# Patient Record
Sex: Male | Born: 1985 | Race: Black or African American | Hispanic: No | Marital: Married
Health system: Southern US, Community
[De-identification: ages and names within clinical notes are randomized; demographics above are authoritative.]

## PROBLEM LIST (undated history)

## (undated) DIAGNOSIS — G8929 Other chronic pain: Secondary | ICD-10-CM

---

## 2018-01-15 ENCOUNTER — Encounter (HOSPITAL_COMMUNITY): Payer: Self-pay

## 2018-01-15 ENCOUNTER — Other Ambulatory Visit: Payer: Self-pay

## 2018-01-15 DIAGNOSIS — J069 Acute upper respiratory infection, unspecified: Secondary | ICD-10-CM | POA: Insufficient documentation

## 2018-01-16 ENCOUNTER — Emergency Department (HOSPITAL_COMMUNITY)
Admission: EM | Admit: 2018-01-16 | Discharge: 2018-01-16 | Disposition: A | Discharge status: Home / Self Care | Payer: Self-pay | Attending: Emergency Medicine | Admitting: Emergency Medicine

## 2018-01-16 DIAGNOSIS — J069 Acute upper respiratory infection, unspecified: Secondary | ICD-10-CM

## 2018-01-16 LAB — INFLUENZA PANEL BY PCR (TYPE A & B)
INFLBPCR: NEGATIVE
Influenza A By PCR: NEGATIVE

## 2018-01-16 NOTE — ED Provider Notes (Signed)
[facility] EMERGENCY DEPARTMENT Provider Note   CSN: [id number] Arrival date & time: 01/15/18  2155     History   Chief Complaint Chief Complaint  Patient presents with  . Fatigue  . Cough    HPI Larry Kaiser is a 32 y.o. male.  Patient without significant medical history presents with complaint of cough, congestion, minor sore throat, cough and body aches. Symptoms started 2 days ago. No nausea or vomiting. He reports infrequent loose stools. He denies fever but reports minor cold chills. No known sick contacts. He is a smoker.    The history is provided by the patient. No language interpreter was used.  Cough  Associated symptoms include chills, rhinorrhea, sore throat and myalgias. Pertinent negatives include no chest pain and no shortness of breath.    No past medical history on file.  There are no active problems to display for this patient.     Home Medications    Prior to Admission medications   Not on File    Family History No family history on file.  Social History Social History   Tobacco Use  . Smoking status: Never Smoker  . Smokeless tobacco: Never Used  Substance Use Topics  . Alcohol use: Yes    Frequency: Never  . Drug use: No     Allergies   Patient has no known allergies.   Review of Systems Review of Systems  Constitutional: Positive for chills. Negative for activity change, appetite change and fever.  HENT: Positive for congestion, rhinorrhea and sore throat.   Respiratory: Positive for cough and chest tightness. Negative for shortness of breath.   Cardiovascular: Negative for chest pain.       Chest pain with cough.  Gastrointestinal: Negative.  Negative for abdominal pain, nausea and vomiting.  Musculoskeletal: Positive for myalgias.  Skin: Negative.  Negative for rash.  Neurological: Negative.      Physical Exam Updated Vital Signs BP 125/81 (BP Location: Right Arm)   Pulse 75   Temp 98.4  F (36.9 C) (Oral)   Resp 17   Ht 6' (1.829 m)   Wt 129.3 kg (285 lb)   SpO2 98%   BMI 38.65 kg/m   Physical Exam  Constitutional: He is oriented to person, place, and time. He appears well-developed and well-nourished.  HENT:  Head: Normocephalic.  Nose: No mucosal edema.  Mouth/Throat: Uvula is midline, oropharynx is clear and moist and mucous membranes are normal. Mucous membranes are not dry.  Neck: Normal range of motion. Neck supple.  Cardiovascular: Normal rate and regular rhythm.  Pulmonary/Chest: Effort normal and breath sounds normal. He has no wheezes. He has no rales. He exhibits no tenderness.  Abdominal: Soft. Bowel sounds are normal. There is no tenderness. There is no rebound and no guarding.  Musculoskeletal: Normal range of motion.  Neurological: He is alert and oriented to person, place, and time.  Skin: Skin is warm and dry. No rash noted.  Psychiatric: He has a normal mood and affect.     ED Treatments / Results  Labs (all labs ordered are listed, but only abnormal results are displayed) Labs Reviewed  INFLUENZA PANEL BY PCR (TYPE A & B)    EKG  EKG Interpretation None       Radiology No results found.  Procedures Procedures (including critical care time)  Medications Ordered in ED Medications - No data to display   Initial Impression / Assessment and Plan / ED Course  I have reviewed the triage vital signs and the nursing notes.  Pertinent labs & imaging results that were available during my care of the patient were reviewed by me and considered in my medical decision making (see chart for details).     Patient here with URI symptoms, afebrile with no history fever. Influenza negative. He is otherwise healthy. VSS. Recommend supportive care for viral URI.  Final Clinical Impressions(s) / ED Diagnoses   Final diagnoses:  None   1. URI  ED Discharge Orders    None       Elpidio AnisUpstill, Tamiya Colello, PA-C 01/16/18 [id number]    Devoria AlbeKnapp, Iva,  MD 01/16/18 0600

## 2018-01-16 NOTE — ED Notes (Signed)
Patient Alert and oriented X4. Stable and ambulatory. Patient verbalized understanding of the discharge instructions.  Patient belongings were taken by the patient.  

## 2019-09-05 ENCOUNTER — Other Ambulatory Visit: Payer: Self-pay

## 2019-09-05 ENCOUNTER — Emergency Department (HOSPITAL_COMMUNITY): Payer: No Typology Code available for payment source

## 2019-09-05 ENCOUNTER — Encounter (HOSPITAL_COMMUNITY): Payer: Self-pay | Admitting: Emergency Medicine

## 2019-09-05 ENCOUNTER — Emergency Department (HOSPITAL_COMMUNITY)
Admission: EM | Admit: 2019-09-05 | Discharge: 2019-09-05 | Disposition: A | Discharge status: Home / Self Care | Payer: No Typology Code available for payment source | Attending: Emergency Medicine | Admitting: Emergency Medicine

## 2019-09-05 DIAGNOSIS — Y9389 Activity, other specified: Secondary | ICD-10-CM | POA: Insufficient documentation

## 2019-09-05 DIAGNOSIS — Y9289 Other specified places as the place of occurrence of the external cause: Secondary | ICD-10-CM | POA: Diagnosis not present

## 2019-09-05 DIAGNOSIS — S199XXA Unspecified injury of neck, initial encounter: Secondary | ICD-10-CM | POA: Diagnosis not present

## 2019-09-05 DIAGNOSIS — S0990XA Unspecified injury of head, initial encounter: Secondary | ICD-10-CM

## 2019-09-05 DIAGNOSIS — Y990 Civilian activity done for income or pay: Secondary | ICD-10-CM | POA: Insufficient documentation

## 2019-09-05 DIAGNOSIS — R519 Headache, unspecified: Secondary | ICD-10-CM | POA: Insufficient documentation

## 2019-09-05 DIAGNOSIS — W208XXA Other cause of strike by thrown, projected or falling object, initial encounter: Secondary | ICD-10-CM | POA: Diagnosis not present

## 2019-09-05 DIAGNOSIS — M542 Cervicalgia: Secondary | ICD-10-CM | POA: Diagnosis present

## 2019-09-05 MED ORDER — NAPROXEN 500 MG PO TABS: 500.0000 mg | ORAL_TABLET | Freq: Two times a day (BID) | ORAL | 0 refills | Status: AC

## 2019-09-05 MED ORDER — METHOCARBAMOL 500 MG PO TABS: 500.0000 mg | ORAL_TABLET | Freq: Two times a day (BID) | ORAL | 0 refills | Status: AC

## 2019-09-05 NOTE — Discharge Instructions (Signed)
Your CT scans were reassuring, use Naprosyn, and Robaxin for pain relief, can use Tylenol in addition to these as well as over-the-counter products such as Biofreeze gel or lidocaine patches.  If symptoms or not improving follow-up with your PCP.  If you develop numbness or weakness in the arms, pain shooting into the arms or any new or worsening symptoms please return for reevaluation.

## 2019-09-05 NOTE — ED Provider Notes (Signed)
[facility]-EMERGENCY DEPT Provider Note   CSN: [id number] Arrival date & time: 09/05/19  1049     History   Chief Complaint Chief Complaint  Patient presents with   Neck Pain    HPI Larry Kaiser is a 33 y.o. male.     Larry Kaiser is a 33 y.o. male who is otherwise healthy, presents to the ED for evaluation of headache and neck pain after injury at work yesterday.  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  This did not cause his neck to bend at all.  He denies loss of consciousness, and did not fall to the ground.  He reports that he has had a mild headache since then and is started to have some worsening neck pain now radiating into his thoracic spine.  He denies pain shooting into either arm aside from some mild soreness over the right shoulder where he has had a previous shoulder injury.  He has no numbness tingling or weakness in the extremities.  No vision changes, vomiting or dizziness.  He reports pain radiates a little bit into the upper thoracic spine, no low back pain.  No loss of bowel or bladder control or saddle anesthesia.  He took some Aleve this morning with some relief, nothing else for pain.     History reviewed. No pertinent past medical history.  There are no active problems to display for this patient.   History reviewed. No pertinent surgical history.      Home Medications    Prior to Admission medications   Medication Sig Start Date End Date Taking? Authorizing Provider  methocarbamol (ROBAXIN) 500 MG tablet Take 1 tablet (500 mg total) by mouth 2 (two) times daily. 09/05/19   Dartha Lodge, PA-C  naproxen (NAPROSYN) 500 MG tablet Take 1 tablet (500 mg total) by mouth 2 (two) times daily. 09/05/19   Dartha Lodge, PA-C    Family History No  family history on file.  Social History Social History   Tobacco Use   Smoking status: Never Smoker   Smokeless tobacco: Never Used  Substance Use Topics   Alcohol use: Yes    Frequency: Never   Drug use: No     Allergies   Patient has no known allergies.   Review of Systems Review of Systems  Constitutional: Negative for chills and fever.  Eyes: Negative for visual disturbance.  Gastrointestinal: Negative for nausea and vomiting.  Musculoskeletal: Positive for back pain and neck pain. Negative for neck stiffness.  Skin: Negative for color change and wound.  Neurological: Positive for headaches. Negative for dizziness, syncope, facial asymmetry, speech difficulty, weakness, light-headedness and numbness.  All other systems reviewed and are negative.    Physical Exam Updated Vital Signs BP 123/81 (BP Location: Right Arm)    Pulse 79    Temp 98.3 F (36.8 C) (Oral)    Resp 18    SpO2 98%   Physical Exam Vitals signs and nursing note reviewed.  Constitutional:      General: He is not in acute distress.    Appearance: Normal appearance. He is well-developed and normal weight. He is not ill-appearing or diaphoretic.  HENT:     Head: Normocephalic.     Comments: There is some  mild tenderness over the crown of the head, no palpable deformity or hematoma, no step-off, no abrasions or lacerations, negative battle sign, no raccoon eyes.    Nose: Nose normal.     Mouth/Throat:     Mouth: Mucous membranes are moist.     Pharynx: Oropharynx is clear.  Eyes:     General:        Right eye: No discharge.        Left eye: No discharge.     Extraocular Movements: Extraocular movements intact.     Conjunctiva/sclera: Conjunctivae normal.     Pupils: Pupils are equal, round, and reactive to light.  Neck:     Musculoskeletal: Muscular tenderness present.     Comments: There is some midline C-spine tenderness without palpable deformity or crepitus, no overlying skin  changes. Pulmonary:     Effort: Pulmonary effort is normal. No respiratory distress.  Musculoskeletal:     Comments: There is some pain radiating into the upper thoracic spine without palpable deformity or crepitus.  No midline lumbar tenderness.  Skin:    General: Skin is warm and dry.  Neurological:     Mental Status: He is alert and oriented to person, place, and time.     Coordination: Coordination normal.     Comments: Speech is clear, able to follow commands CN III-XII intact Normal strength in upper and lower extremities bilaterally including dorsiflexion and plantar flexion, strong and equal grip strength Sensation normal to light and sharp touch Moves extremities without ataxia, coordination intact Normal finger to nose and rapid alternating movements No pronator drift  Psychiatric:        Mood and Affect: Mood normal.        Behavior: Behavior normal.      ED Treatments / Results  Labs (all labs ordered are listed, but only abnormal results are displayed) Labs Reviewed - No data to display  EKG None  Radiology Ct Head Wo Contrast  Result Date: 09/05/2019 CLINICAL DATA:  Posttraumatic headache and neck pain after injury yesterday. EXAM: CT HEAD WITHOUT CONTRAST CT CERVICAL SPINE WITHOUT CONTRAST TECHNIQUE: Multidetector CT imaging of the head and cervical spine was performed following the standard protocol without intravenous contrast. Multiplanar CT image reconstructions of the cervical spine were also generated. COMPARISON:  None. FINDINGS: CT HEAD FINDINGS Brain: No evidence of acute infarction, hemorrhage, hydrocephalus, extra-axial collection or mass lesion/mass effect. Vascular: No hyperdense vessel or unexpected calcification. Skull: Normal. Negative for fracture or focal lesion. Sinuses/Orbits: No acute finding. Other: None. CT CERVICAL SPINE FINDINGS Alignment: Normal. Skull base and vertebrae: No acute fracture. No primary bone lesion or focal pathologic process.  Soft tissues and spinal canal: No prevertebral fluid or swelling. No visible canal hematoma. Disc levels:  Normal. Upper chest: Negative. Other: None. IMPRESSION: Normal head CT. Normal cervical spine. Electronically Signed   By: Lupita RaiderJames  Green Jr M.D.   On: 09/05/2019 13:08   Ct Cervical Spine Wo Contrast  Result Date: 09/05/2019 CLINICAL DATA:  Posttraumatic headache and neck pain after injury yesterday. EXAM: CT HEAD WITHOUT CONTRAST CT CERVICAL SPINE WITHOUT CONTRAST TECHNIQUE: Multidetector CT imaging of the head and cervical spine was performed following the standard protocol without intravenous contrast. Multiplanar CT image reconstructions of the cervical spine were also generated. COMPARISON:  None. FINDINGS: CT HEAD FINDINGS Brain: No evidence of acute infarction, hemorrhage, hydrocephalus, extra-axial collection or mass lesion/mass effect. Vascular: No hyperdense vessel or unexpected calcification. Skull: Normal. Negative for fracture or focal lesion. Sinuses/Orbits:  No acute finding. Other: None. CT CERVICAL SPINE FINDINGS Alignment: Normal. Skull base and vertebrae: No acute fracture. No primary bone lesion or focal pathologic process. Soft tissues and spinal canal: No prevertebral fluid or swelling. No visible canal hematoma. Disc levels:  Normal. Upper chest: Negative. Other: None. IMPRESSION: Normal head CT. Normal cervical spine. Electronically Signed   By: Marijo Conception M.D.   On: 09/05/2019 13:08    Procedures Procedures (including critical care time)  Medications Ordered in ED Medications - No data to display   Initial Impression / Assessment and Plan / ED Course  I have reviewed the triage vital signs and the nursing notes.  Pertinent labs & imaging results that were available during my care of the patient were reviewed by me and considered in my medical decision making (see chart for details).  Patient had injury to the head and neck yesterday when a 30 pound box fell on top  of his head at work.  This did not cause any hyperextension or flexion of the neck.  He reports headache with pain radiating from the neck into the upper thoracic spine, no associated numbness or weakness.  Normal neurologic exam here and normal vitals.  Well-appearing.  Will get CT of the head and neck and thoracic spine films.  CT of the head and C-spine unremarkable, given completely normal C-spine films I have extremely low suspicion for thoracic spine injury especially since pain just started to radiate into this area today, discussed with patient and had shared decision-making conversation, okay with holding off on additional imaging of thoracic spine.  Will treat with anti-inflammatories and muscle relaxer as well as topical over-the-counter medications like Biofreeze gel or lidocaine patches and have patient follow-up if symptoms not improving.  Return precautions discussed.  Patient expresses understanding and agreement with plan.  Discharged home in good condition.  Final Clinical Impressions(s) / ED Diagnoses   Final diagnoses:  Injury of head, initial encounter  Injury of neck, initial encounter    ED Discharge Orders         Ordered    naproxen (NAPROSYN) 500 MG tablet  2 times daily     09/05/19 1341    methocarbamol (ROBAXIN) 500 MG tablet  2 times daily     09/05/19 1341           Benedetto Goad [place], [place] 09/05/19 1343    Gareth Morgan, MD 09/05/19 2348

## 2019-09-05 NOTE — ED Triage Notes (Signed)
Patient reports approximately 30 pound box fell three feet onto top of patient's head at work yesterday. C/o neck pain radiating down right arm with intermittent headache. Denies LOC.

## 2019-10-28 ENCOUNTER — Emergency Department (HOSPITAL_COMMUNITY): Payer: No Typology Code available for payment source

## 2019-10-28 ENCOUNTER — Emergency Department (HOSPITAL_COMMUNITY)
Admission: EM | Admit: 2019-10-28 | Discharge: 2019-10-28 | Disposition: A | Discharge status: Home / Self Care | Payer: No Typology Code available for payment source | Attending: Emergency Medicine | Admitting: Emergency Medicine

## 2019-10-28 ENCOUNTER — Encounter (HOSPITAL_COMMUNITY): Payer: Self-pay | Admitting: *Deleted

## 2019-10-28 ENCOUNTER — Other Ambulatory Visit: Payer: Self-pay

## 2019-10-28 DIAGNOSIS — S8782XA Crushing injury of left lower leg, initial encounter: Secondary | ICD-10-CM | POA: Insufficient documentation

## 2019-10-28 DIAGNOSIS — Y999 Unspecified external cause status: Secondary | ICD-10-CM | POA: Insufficient documentation

## 2019-10-28 DIAGNOSIS — S060X1A Concussion with loss of consciousness of 30 minutes or less, initial encounter: Secondary | ICD-10-CM | POA: Diagnosis not present

## 2019-10-28 DIAGNOSIS — Y93I9 Activity, other involving external motion: Secondary | ICD-10-CM | POA: Insufficient documentation

## 2019-10-28 DIAGNOSIS — S0990XA Unspecified injury of head, initial encounter: Secondary | ICD-10-CM | POA: Diagnosis present

## 2019-10-28 DIAGNOSIS — Y92410 Unspecified street and highway as the place of occurrence of the external cause: Secondary | ICD-10-CM | POA: Insufficient documentation

## 2019-10-28 HISTORY — DX: Other chronic pain: G89.29

## 2019-10-28 LAB — COMPREHENSIVE METABOLIC PANEL
ALT: 27 U/L (ref 0–44)
AST: 22 U/L (ref 15–41)
Albumin: 3.8 g/dL (ref 3.5–5.0)
Alkaline Phosphatase: 77 U/L (ref 38–126)
Anion gap: 14 (ref 5–15)
BUN: 19 mg/dL (ref 6–20)
CO2: 22 mmol/L (ref 22–32)
Calcium: 9.6 mg/dL (ref 8.9–10.3)
Chloride: 106 mmol/L (ref 98–111)
Creatinine, Ser: 1.29 mg/dL — ABNORMAL HIGH (ref 0.61–1.24)
GFR calc Af Amer: >60 mL/min (ref >60)
GFR calc non Af Amer: >60 mL/min (ref >60)
Glucose, Bld: 116 mg/dL — ABNORMAL HIGH (ref 70–99)
Potassium: 3.5 mmol/L (ref 3.5–5.1)
Sodium: 142 mmol/L (ref 135–145)
Total Bilirubin: 0.5 mg/dL (ref 0.3–1.2)
Total Protein: 6.6 g/dL (ref 6.5–8.1)

## 2019-10-28 LAB — PROTIME-INR
INR: 0.9 (ref 0.8–1.2)
Prothrombin Time: 12.2 seconds (ref 11.4–15.2)

## 2019-10-28 LAB — LACTIC ACID, PLASMA: Lactic Acid, Venous: 2.8 mmol/L (ref 0.5–1.9)

## 2019-10-28 LAB — CBC
HCT: 44.1 % (ref 39.0–52.0)
Hemoglobin: 14.7 g/dL (ref 13.0–17.0)
MCH: 30.8 pg (ref 26.0–34.0)
MCHC: 33.3 g/dL (ref 30.0–36.0)
MCV: 92.5 fL (ref 80.0–100.0)
Platelets: 303 10*3/uL (ref 150–400)
RBC: 4.77 MIL/uL (ref 4.22–5.81)
RDW: 12.3 % (ref 11.5–15.5)
WBC: 9 10*3/uL (ref 4.0–10.5)
nRBC: 0 % (ref 0.0–0.2)

## 2019-10-28 LAB — ETHANOL: Alcohol, Ethyl (B): <10 mg/dL (ref <10)

## 2019-10-28 LAB — CK: Total CK: 324 U/L (ref 49–397)

## 2019-10-28 LAB — SAMPLE TO BLOOD BANK

## 2019-10-28 LAB — I-STAT CHEM 8, ED
BUN: 23 mg/dL — ABNORMAL HIGH (ref 6–20)
Calcium, Ion: 1.12 mmol/L — ABNORMAL LOW (ref 1.15–1.40)
Chloride: 105 mmol/L (ref 98–111)
Creatinine, Ser: 1.1 mg/dL (ref 0.61–1.24)
Glucose, Bld: 111 mg/dL — ABNORMAL HIGH (ref 70–99)
HCT: 44 % (ref 39.0–52.0)
Hemoglobin: 15 g/dL (ref 13.0–17.0)
Potassium: 3.6 mmol/L (ref 3.5–5.1)
Sodium: 141 mmol/L (ref 135–145)
TCO2: 24 mmol/L (ref 22–32)

## 2019-10-28 LAB — CDS SEROLOGY

## 2019-10-28 MED ORDER — MORPHINE SULFATE 15 MG PO TABS: 15.0000 mg | ORAL_TABLET | ORAL | 0 refills | Status: AC | PRN

## 2019-10-28 MED ORDER — IOHEXOL 350 MG/ML SOLN
80.0000 mL | Freq: Once | INTRAVENOUS | Status: AC | PRN
  Administered 2019-10-28: 22:00:00 80 mL via INTRAVENOUS

## 2019-10-28 MED ORDER — MORPHINE SULFATE (PF) 4 MG/ML IV SOLN
8.0000 mg | Freq: Once | INTRAVENOUS | Status: AC
  Administered 2019-10-28: 8 mg via INTRAVENOUS
  Filled 2019-10-28: qty 2

## 2019-10-28 MED ORDER — ONDANSETRON HCL 4 MG/2ML IJ SOLN
4.0000 mg | Freq: Once | INTRAMUSCULAR | Status: AC
  Administered 2019-10-28: 4 mg via INTRAVENOUS
  Filled 2019-10-28: qty 2

## 2019-10-28 MED ORDER — MORPHINE SULFATE (PF) 4 MG/ML IV SOLN
4.0000 mg | Freq: Once | INTRAVENOUS | Status: AC
  Administered 2019-10-28: 4 mg via INTRAVENOUS
  Filled 2019-10-28: qty 1

## 2019-10-28 NOTE — ED Notes (Signed)
Patient verbalizes understanding of discharge instructions. Opportunity for questioning and answers were provided. Armband removed by staff, pt discharged from ED in wheelchair but a,bulatory with crutches provided.

## 2019-10-28 NOTE — ED Notes (Signed)
Checked on Pt and observed BP 92/57 resp at 8 and stats in 80's. Repositioned Pt. Reassessed pulse ox. Pt. Appears to have had acute mental change but could answer name and simple questions. Pt. Slow to answer and groggy. Put Pt. On 2 L O2. Sat Pt  up. Stats 92. BP 120/67 resp. 17. Pt. Has sleep apena and shows periods of 0 resp. Pt's wife states he sleeps with a cpap. MD notified.
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
## 2019-10-28 NOTE — ED Notes (Signed)
C collar removed by Dr. Floyd.  

## 2019-10-28 NOTE — Discharge Instructions (Addendum)
Take 4 over the counter ibuprofen tablets 3 times a day or 2 over-the-counter naproxen tablets twice a day for pain. Also take tylenol 1000mg (2 extra strength) four times a day.   You will hurt worse tomorrow that is normal.  Please return for numbness or tingling to the leg return for shortness of breath.  Your confusion should continue to get better.  Things that tend to make this worse are things that use her brain like mathematics or language.  Follow-up with your family doctor for this.  I have attached information for the concussion clinic if you would like to see them in the office.
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
## 2019-10-28 NOTE — ED Notes (Signed)
Wife at bedside.  Pt continues repeating the same questions.

## 2019-10-29 ENCOUNTER — Encounter (HOSPITAL_COMMUNITY): Payer: Self-pay | Admitting: Emergency Medicine
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
